# Patient Record
Sex: Male | Born: 1960 | Race: White | Hispanic: No | Marital: Single
Health system: Southern US, Community
[De-identification: ages and names within clinical notes are randomized; demographics above are authoritative.]

---

## 2014-09-29 ENCOUNTER — Inpatient Hospital Stay: Payer: Self-pay | Admitting: Internal Medicine

## 2014-09-29 LAB — COMPREHENSIVE METABOLIC PANEL
Albumin: 3.6 g/dL (ref 3.4–5.0)
Alkaline Phosphatase: 82 U/L
Anion Gap: 9 (ref 7–16)
BUN: 17 mg/dL (ref 7–18)
Bilirubin,Total: 0.5 mg/dL (ref 0.2–1.0)
CHLORIDE: 105 mmol/L (ref 98–107)
CO2: 27 mmol/L (ref 21–32)
Calcium, Total: 8.6 mg/dL (ref 8.5–10.1)
Creatinine: 0.89 mg/dL (ref 0.60–1.30)
EGFR (Non-African Amer.): >60
Glucose: 241 mg/dL — ABNORMAL HIGH (ref 65–99)
OSMOLALITY: 291 (ref 275–301)
POTASSIUM: 4 mmol/L (ref 3.5–5.1)
SGOT(AST): 26 U/L (ref 15–37)
SGPT (ALT): 19 U/L
SODIUM: 141 mmol/L (ref 136–145)
Total Protein: 7.5 g/dL (ref 6.4–8.2)

## 2014-09-29 LAB — PROTIME-INR
INR: 1.1
PROTHROMBIN TIME: 14.2 s (ref 11.5–14.7)

## 2014-09-29 LAB — CBC
HCT: 32.6 % — ABNORMAL LOW (ref 40.0–52.0)
HGB: 10.3 g/dL — AB (ref 13.0–18.0)
MCH: 28.8 pg (ref 26.0–34.0)
MCHC: 31.7 g/dL — ABNORMAL LOW (ref 32.0–36.0)
MCV: 91 fL (ref 80–100)
Platelet: 180 10*3/uL (ref 150–440)
RBC: 3.59 10*6/uL — AB (ref 4.40–5.90)
RDW: 14.6 % — ABNORMAL HIGH (ref 11.5–14.5)
WBC: 8.1 10*3/uL (ref 3.8–10.6)

## 2014-09-29 LAB — HEPATIC FUNCTION PANEL A (ARMC)
Albumin: 3.7 g/dL (ref 3.4–5.0)
Alkaline Phosphatase: 81 U/L
BILIRUBIN DIRECT: 0.1 mg/dL (ref 0.0–0.2)
Bilirubin,Total: 0.5 mg/dL (ref 0.2–1.0)
SGOT(AST): 24 U/L (ref 15–37)
SGPT (ALT): 22 U/L
TOTAL PROTEIN: 7.5 g/dL (ref 6.4–8.2)

## 2014-09-29 LAB — DRUG SCREEN, URINE

## 2014-09-29 LAB — ACETAMINOPHEN LEVEL: Acetaminophen: <2 ug/mL

## 2014-09-29 LAB — URINALYSIS, COMPLETE
BILIRUBIN, UR: NEGATIVE
Blood: NEGATIVE
Leukocyte Esterase: NEGATIVE
NITRITE: NEGATIVE
PH: 5 (ref 4.5–8.0)
RBC,UR: <1 /HPF (ref 0–5)
SPECIFIC GRAVITY: 1.017 (ref 1.003–1.030)
Squamous Epithelial: NONE SEEN
WBC UR: <1 /HPF (ref 0–5)

## 2014-09-29 LAB — TROPONIN I: Troponin-I: <0.02 ng/mL

## 2014-09-29 LAB — ETHANOL: Ethanol: <3 mg/dL

## 2014-09-29 LAB — SALICYLATE LEVEL: Salicylates, Serum: <1.7 mg/dL

## 2014-09-29 LAB — APTT: Activated PTT: 29.9 secs (ref 23.6–35.9)

## 2014-09-29 LAB — CK TOTAL AND CKMB (NOT AT ARMC)
CK, TOTAL: 210 U/L
CK-MB: 2 ng/mL (ref 0.5–3.6)

## 2014-09-30 LAB — BASIC METABOLIC PANEL
Anion Gap: 6 — ABNORMAL LOW (ref 7–16)
BUN: 17 mg/dL (ref 7–18)
CALCIUM: 8.1 mg/dL — AB (ref 8.5–10.1)
CREATININE: 0.61 mg/dL (ref 0.60–1.30)
Chloride: 112 mmol/L — ABNORMAL HIGH (ref 98–107)
Co2: 27 mmol/L (ref 21–32)
EGFR (African American): >60
Glucose: 159 mg/dL — ABNORMAL HIGH (ref 65–99)
OSMOLALITY: 294 (ref 275–301)
Potassium: 3.8 mmol/L (ref 3.5–5.1)
Sodium: 145 mmol/L (ref 136–145)

## 2014-09-30 LAB — CBC WITH DIFFERENTIAL/PLATELET
BASOS ABS: 0 10*3/uL (ref 0.0–0.1)
Basophil %: 0.6 %
Eosinophil #: 0.1 10*3/uL (ref 0.0–0.7)
Eosinophil %: 1.6 %
HCT: 28.4 % — ABNORMAL LOW (ref 40.0–52.0)
HGB: 9.1 g/dL — ABNORMAL LOW (ref 13.0–18.0)
Lymphocyte #: 1.5 10*3/uL (ref 1.0–3.6)
Lymphocyte %: 27.5 %
MCH: 29.5 pg (ref 26.0–34.0)
MCHC: 32 g/dL (ref 32.0–36.0)
MCV: 92 fL (ref 80–100)
Monocyte #: 0.5 x10 3/mm (ref 0.2–1.0)
Monocyte %: 8.4 %
NEUTROS ABS: 3.4 10*3/uL (ref 1.4–6.5)
NEUTROS PCT: 61.9 %
Platelet: 145 10*3/uL — ABNORMAL LOW (ref 150–440)
RBC: 3.09 10*6/uL — ABNORMAL LOW (ref 4.40–5.90)
RDW: 14.6 % — ABNORMAL HIGH (ref 11.5–14.5)
WBC: 5.5 10*3/uL (ref 3.8–10.6)

## 2014-09-30 LAB — TSH: Thyroid Stimulating Horm: 1.58 u[IU]/mL

## 2014-09-30 LAB — LIPID PANEL
CHOLESTEROL: 128 mg/dL (ref 0–200)
HDL: 35 mg/dL — AB (ref 40–60)
Ldl Cholesterol, Calc: 57 mg/dL (ref 0–100)
TRIGLYCERIDES: 180 mg/dL (ref 0–200)
VLDL Cholesterol, Calc: 36 mg/dL (ref 5–40)

## 2014-09-30 LAB — MAGNESIUM: Magnesium: 1.7 mg/dL — ABNORMAL LOW

## 2014-09-30 LAB — HEMOGLOBIN A1C: Hemoglobin A1C: 8.3 % — ABNORMAL HIGH (ref 4.2–6.3)
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
## 2017-04-06 DEATH — deceased
